# Patient Record
Sex: Female | Born: 1955 | Race: White | Hispanic: No | Marital: Married | Smoking: Never smoker
Health system: Southern US, Community
[De-identification: ages and names within clinical notes are randomized; demographics above are authoritative.]

## PROBLEM LIST (undated history)

## (undated) DIAGNOSIS — E669 Obesity, unspecified: Secondary | ICD-10-CM

## (undated) DIAGNOSIS — M1990 Unspecified osteoarthritis, unspecified site: Secondary | ICD-10-CM

## (undated) DIAGNOSIS — F32A Depression, unspecified: Secondary | ICD-10-CM

## (undated) DIAGNOSIS — F329 Major depressive disorder, single episode, unspecified: Secondary | ICD-10-CM

## (undated) DIAGNOSIS — M797 Fibromyalgia: Secondary | ICD-10-CM

## (undated) DIAGNOSIS — F419 Anxiety disorder, unspecified: Secondary | ICD-10-CM

---

## 1997-07-06 ENCOUNTER — Other Ambulatory Visit: Admission: RE | Admit: 1997-07-06 | Discharge: 1997-07-06 | Payer: Self-pay | Admitting: Obstetrics & Gynecology

## 1997-07-15 ENCOUNTER — Other Ambulatory Visit: Admission: RE | Admit: 1997-07-15 | Discharge: 1997-07-15 | Payer: Self-pay | Admitting: Family Medicine

## 1999-04-05 ENCOUNTER — Emergency Department (HOSPITAL_COMMUNITY): Admission: EM | Admit: 1999-04-05 | Discharge: 1999-04-05 | Payer: Self-pay | Admitting: Emergency Medicine

## 1999-04-05 ENCOUNTER — Encounter: Payer: Self-pay | Admitting: Emergency Medicine

## 2000-05-20 ENCOUNTER — Emergency Department (HOSPITAL_COMMUNITY): Admission: EM | Admit: 2000-05-20 | Discharge: 2000-05-20 | Payer: Self-pay | Admitting: Emergency Medicine

## 2000-05-20 ENCOUNTER — Encounter: Payer: Self-pay | Admitting: Emergency Medicine

## 2000-07-02 ENCOUNTER — Ambulatory Visit (HOSPITAL_COMMUNITY): Admission: RE | Admit: 2000-07-02 | Discharge: 2000-07-02 | Payer: Self-pay | Admitting: Neurology

## 2000-07-02 ENCOUNTER — Encounter: Payer: Self-pay | Admitting: Neurology

## 2013-05-20 ENCOUNTER — Emergency Department (HOSPITAL_COMMUNITY)
Admission: EM | Admit: 2013-05-20 | Discharge: 2013-05-21 | Disposition: A | Discharge status: Home / Self Care | Payer: Worker's Compensation | Attending: Emergency Medicine | Admitting: Emergency Medicine

## 2013-05-20 ENCOUNTER — Emergency Department (HOSPITAL_COMMUNITY): Payer: Worker's Compensation

## 2013-05-20 ENCOUNTER — Encounter (HOSPITAL_COMMUNITY): Payer: Self-pay | Admitting: Emergency Medicine

## 2013-05-20 DIAGNOSIS — R112 Nausea with vomiting, unspecified: Secondary | ICD-10-CM

## 2013-05-20 DIAGNOSIS — Z9889 Other specified postprocedural states: Secondary | ICD-10-CM | POA: Insufficient documentation

## 2013-05-20 DIAGNOSIS — R070 Pain in throat: Secondary | ICD-10-CM | POA: Insufficient documentation

## 2013-05-20 DIAGNOSIS — J029 Acute pharyngitis, unspecified: Secondary | ICD-10-CM | POA: Insufficient documentation

## 2013-05-20 DIAGNOSIS — Z79899 Other long term (current) drug therapy: Secondary | ICD-10-CM | POA: Insufficient documentation

## 2013-05-20 LAB — COMPREHENSIVE METABOLIC PANEL
ALT: 41 U/L — ABNORMAL HIGH (ref 0–35)
AST: 41 U/L — ABNORMAL HIGH (ref 0–37)
Albumin: 3.8 g/dL (ref 3.5–5.2)
Alkaline Phosphatase: 104 U/L (ref 39–117)
BUN: 14 mg/dL (ref 6–23)
CO2: 23 mEq/L (ref 19–32)
Calcium: 9.1 mg/dL (ref 8.4–10.5)
Chloride: 97 mEq/L (ref 96–112)
Creatinine, Ser: 1.3 mg/dL — ABNORMAL HIGH (ref 0.50–1.10)
GFR calc Af Amer: 51 mL/min — ABNORMAL LOW (ref >90)
GFR calc non Af Amer: 44 mL/min — ABNORMAL LOW (ref >90)
Glucose, Bld: 131 mg/dL — ABNORMAL HIGH (ref 70–99)
Potassium: 4.1 mEq/L (ref 3.7–5.3)
Sodium: 137 mEq/L (ref 137–147)
Total Bilirubin: 0.5 mg/dL (ref 0.3–1.2)
Total Protein: 7.8 g/dL (ref 6.0–8.3)

## 2013-05-20 LAB — CBC WITH DIFFERENTIAL/PLATELET
Basophils Absolute: 0 10*3/uL (ref 0.0–0.1)
Basophils Relative: 0 % (ref 0–1)
Eosinophils Absolute: 0.1 10*3/uL (ref 0.0–0.7)
Eosinophils Relative: 1 % (ref 0–5)
HCT: 38.9 % (ref 36.0–46.0)
Hemoglobin: 13.4 g/dL (ref 12.0–15.0)
Lymphocytes Relative: 10 % — ABNORMAL LOW (ref 12–46)
Lymphs Abs: 1.7 10*3/uL (ref 0.7–4.0)
MCH: 30.9 pg (ref 26.0–34.0)
MCHC: 34.4 g/dL (ref 30.0–36.0)
MCV: 89.6 fL (ref 78.0–100.0)
Monocytes Absolute: 1.1 10*3/uL — ABNORMAL HIGH (ref 0.1–1.0)
Monocytes Relative: 7 % (ref 3–12)
Neutro Abs: 13.6 10*3/uL — ABNORMAL HIGH (ref 1.7–7.7)
Neutrophils Relative %: 83 % — ABNORMAL HIGH (ref 43–77)
Platelets: 207 10*3/uL (ref 150–400)
RBC: 4.34 MIL/uL (ref 3.87–5.11)
RDW: 12.5 % (ref 11.5–15.5)
WBC: 16.5 10*3/uL — ABNORMAL HIGH (ref 4.0–10.5)

## 2013-05-20 MED ORDER — MAGIC MOUTHWASH: 5.0000 mL | Freq: Three times a day (TID) | ORAL | Status: AC | PRN

## 2013-05-20 MED ORDER — GI COCKTAIL ~~LOC~~
30.0000 mL | Freq: Once | ORAL | Status: AC
  Administered 2013-05-20: 30 mL via ORAL
  Filled 2013-05-20: qty 30

## 2013-05-20 MED ORDER — HYDROCODONE-ACETAMINOPHEN 7.5-325 MG/15ML PO SOLN: 15.0000 mL | Freq: Three times a day (TID) | ORAL | Status: AC | PRN

## 2013-05-20 MED ORDER — ONDANSETRON HCL 4 MG/2ML IJ SOLN
4.0000 mg | Freq: Once | INTRAMUSCULAR | Status: AC
  Administered 2013-05-20: 4 mg via INTRAVENOUS
  Filled 2013-05-20: qty 2

## 2013-05-20 MED ORDER — SODIUM CHLORIDE 0.9 % IV BOLUS (SEPSIS)
1000.0000 mL | INTRAVENOUS | Status: AC
  Administered 2013-05-20: 1000 mL via INTRAVENOUS

## 2013-05-20 MED ORDER — ONDANSETRON 4 MG PO TBDP: 4.0000 mg | ORAL_TABLET | Freq: Three times a day (TID) | ORAL | Status: AC | PRN

## 2013-05-20 MED ORDER — MORPHINE SULFATE 4 MG/ML IJ SOLN
4.0000 mg | Freq: Once | INTRAMUSCULAR | Status: AC
  Administered 2013-05-20: 4 mg via INTRAVENOUS
  Filled 2013-05-20: qty 1

## 2013-05-20 MED ORDER — PANTOPRAZOLE SODIUM 40 MG IV SOLR
40.0000 mg | Freq: Once | INTRAVENOUS | Status: AC
  Administered 2013-05-20: 40 mg via INTRAVENOUS
  Filled 2013-05-20: qty 40

## 2013-05-20 NOTE — ED Notes (Signed)
Pt states unable to keep anything down x 2 weeks. Hx surgery on c5-c6

## 2013-05-20 NOTE — ED Notes (Signed)
PA at bedside.

## 2013-05-20 NOTE — ED Notes (Signed)
Patient transported to X-ray 

## 2013-05-20 NOTE — ED Notes (Signed)
This RN attempted to start IV X 2 unsuccessfully. Primary RN notified.

## 2013-05-20 NOTE — ED Provider Notes (Signed)
CSN: [id number]     Arrival date & time 05/20/13  1848 History   First MD Initiated Contact with Patient 05/20/13 2015     Chief Complaint  Patient presents with  . Emesis     (Consider location/radiation/quality/duration/timing/severity/associated sxs/prior Treatment) HPI Pt is a 58yo female presenting 3 weeks after surgery to C5-C6. Pt states since surgery she has not been able to keep anything down due to pain, nausea and vomiting. Pt states she did have a 2 week f/u with surgical PA who stated she may start to eat normal foods as tolerated, because pt has still had difficulty and pain, PA recommended endoscopy for tomorrow morning to determine cause of pt's prolonged throat pain and difficulty eating. Reports worsened symptoms in last 4 days. Reports temp of 101 yesterday and 8 episodes of emesis.  Denies fever or emesis today. Denies blood or bile in emesis. Denies sick contacts.  States she called her doctor today but states they did not call back until after 5pm, RN recommended pt come to ED stating she may be dehydrated.    No past medical history on file. No past surgical history on file. No family history on file. History  Substance Use Topics  . Smoking status: Never Smoker   . Smokeless tobacco: Never Used  . Alcohol Use: No   OB History   Grav Para Term Preterm Abortions TAB SAB Ect Mult Living                 Review of Systems  Constitutional: Positive for fever and chills. Negative for fatigue.  HENT: Positive for sore throat. Negative for voice change.   Respiratory: Positive for cough. Negative for shortness of breath.   Cardiovascular: Negative for chest pain.  Gastrointestinal: Positive for nausea and vomiting. Negative for abdominal pain and diarrhea.  All other systems reviewed and are negative.      Allergies  Review of patient's allergies indicates no known allergies.  Home Medications   Current Outpatient Rx  Name  Route  Sig  Dispense  Refill  .  diazepam (VALIUM) 5 MG tablet   Oral   Take 5 mg by mouth every 6 (six) hours as needed for anxiety.         Marland Kitchen HYDROcodone-acetaminophen (NORCO/VICODIN) 5-325 MG per tablet   Oral   Take 0.5 tablets by mouth every 6 (six) hours as needed for moderate pain.          . phenol (CHLORASEPTIC) 1.4 % LIQD   Mouth/Throat   Use as directed 5 sprays in the mouth or throat as needed for throat irritation / pain.          . promethazine (PHENERGAN) 25 MG tablet   Oral   Take 25 mg by mouth every 6 (six) hours as needed for nausea or vomiting.         . RABEprazole (ACIPHEX) 20 MG tablet   Oral   Take 20 mg by mouth daily.         . traMADol (ULTRAM) 50 MG tablet   Oral   Take 50 mg by mouth every 6 (six) hours as needed.         . Alum & Mag Hydroxide-Simeth (MAGIC MOUTHWASH) SOLN   Oral   Take 5 mLs by mouth 3 (three) times daily as needed for mouth pain.   20 mL   0   . HYDROcodone-acetaminophen (HYCET) 7.5-325 mg/15 ml solution   Oral   Take 15  mLs by mouth every 8 (eight) hours as needed for moderate pain.   120 mL   0   . ondansetron (ZOFRAN ODT) 4 MG disintegrating tablet   Oral   Take 1 tablet (4 mg total) by mouth every 8 (eight) hours as needed for nausea.   10 tablet   0    BP 106/68  Pulse 103  Temp(Src) 99 F (37.2 C) (Oral)  Resp 20  Ht 5\' 4"  (1.626 m)  Wt 220 lb (99.791 kg)  BMI 37.74 kg/m2  SpO2 91% Physical Exam  Nursing note and vitals reviewed. Constitutional: She appears well-developed and well-nourished. No distress.  Pt lying comfortably in exam bed, NAD. Holding emesis bag but no active vomiting.  HENT:  Head: Normocephalic and atraumatic.  Eyes: Conjunctivae are normal. No scleral icterus.  Neck: Normal range of motion. Neck supple. No JVD present. No tracheal deviation present. No thyromegaly present.  Cardiovascular: Normal rate, regular rhythm and normal heart sounds.   Pulmonary/Chest: Effort normal and breath sounds normal. No  stridor. No respiratory distress. She has no wheezes. She has no rales. She exhibits no tenderness.  No respiratory distress, able to speak in full sentences w/o difficulty. Lungs: CTAB. Occasional productive cough  Abdominal: Soft. Bowel sounds are normal. She exhibits no distension and no mass. There is no tenderness. There is no rebound and no guarding.  Soft, non-distended, non-tender. No CVAT  Musculoskeletal: Normal range of motion.  Lymphadenopathy:    She has no cervical adenopathy.  Neurological: She is alert.  Skin: Skin is warm and dry. She is not diaphoretic.    ED Course  Procedures (including critical care time) Labs Review Labs Reviewed  CBC WITH DIFFERENTIAL - Abnormal; Notable for the following:    WBC 16.5 (*)    Neutrophils Relative % 83 (*)    Neutro Abs 13.6 (*)    Lymphocytes Relative 10 (*)    Monocytes Absolute 1.1 (*)    All other components within normal limits  COMPREHENSIVE METABOLIC PANEL - Abnormal; Notable for the following:    Glucose, Bld 131 (*)    Creatinine, Ser 1.30 (*)    AST 41 (*)    ALT 41 (*)    GFR calc non Af Amer 44 (*)    GFR calc Af Amer 51 (*)    All other components within normal limits   Imaging Review Dg Chest 2 View  05/20/2013   CLINICAL DATA:  Cough  EXAM: CHEST  2 VIEW  COMPARISON:  None.  FINDINGS: The heart size and mediastinal contours are within normal limits. Both lungs are clear. Degenerative joint changes of the spine are noted. Patient is status post prior fixation of lower cervical spine. Prior cholecystectomy clips are noted.  IMPRESSION: No active cardiopulmonary disease.   Electronically Signed   By: Sherian ReinWei-Chen  Lin M.D.   On: 05/20/2013 21:35    EKG Interpretation   None       MDM   Final diagnoses:  Throat pain  Nausea and vomiting    Pt appears well, non-toxic, mucous membranes moist.  No active vomiting or dry heaving. Pt c/o throat pain.  Pt requesting to stay due to snow.  CBC: WBC-16.5 CMP:  AST-41 ALT-41, GFR-51.  No previous labs to compare to.  Low concern for cholecystitis as pt is afebrile, abd-soft, non-distended, non-tender, no murphy's sign.   CXR obtained due to intermittent cough and WBC count.   CXR: no active cardiopulmonary disease.  No vomiting while in ED. Pt able to keep down 1/2 cup of ice chips.  Discussed pt with Dr. Jeraldine Loots, will discharge pt home with pain and nausea medication. Pt advised to f/u as scheduled with PCP and surgeon to discuss further evaluation via endoscopy for throat pain. Pt verbalized understanding and agreement with tx plan.    Junius Finner, PA-C 05/21/13 0045

## 2013-05-21 NOTE — ED Provider Notes (Signed)
Medical screening examination/treatment/procedure(s) were performed by non-physician practitioner and as supervising physician I was immediately available for consultation/collaboration.  Jerald Hennington, MD 05/21/13 1600 

## 2015-02-08 ENCOUNTER — Other Ambulatory Visit: Payer: Self-pay | Admitting: Nurse Practitioner

## 2015-02-08 DIAGNOSIS — N6001 Solitary cyst of right breast: Secondary | ICD-10-CM

## 2015-02-15 ENCOUNTER — Ambulatory Visit
Admission: RE | Admit: 2015-02-15 | Discharge: 2015-02-15 | Disposition: A | Discharge status: Home / Self Care | Payer: BC Managed Care – PPO | Source: Ambulatory Visit | Attending: Nurse Practitioner | Admitting: Nurse Practitioner

## 2015-02-15 DIAGNOSIS — N6001 Solitary cyst of right breast: Secondary | ICD-10-CM

## 2015-04-24 ENCOUNTER — Emergency Department (HOSPITAL_COMMUNITY): Payer: BC Managed Care – PPO

## 2015-04-24 ENCOUNTER — Emergency Department (HOSPITAL_COMMUNITY)
Admission: EM | Admit: 2015-04-24 | Discharge: 2015-04-24 | Disposition: A | Discharge status: Home / Self Care | Payer: BC Managed Care – PPO | Attending: Emergency Medicine | Admitting: Emergency Medicine

## 2015-04-24 ENCOUNTER — Encounter (HOSPITAL_COMMUNITY): Payer: Self-pay | Admitting: *Deleted

## 2015-04-24 DIAGNOSIS — R55 Syncope and collapse: Secondary | ICD-10-CM | POA: Insufficient documentation

## 2015-04-24 DIAGNOSIS — Z79899 Other long term (current) drug therapy: Secondary | ICD-10-CM | POA: Diagnosis not present

## 2015-04-24 DIAGNOSIS — E669 Obesity, unspecified: Secondary | ICD-10-CM | POA: Diagnosis not present

## 2015-04-24 DIAGNOSIS — F329 Major depressive disorder, single episode, unspecified: Secondary | ICD-10-CM | POA: Insufficient documentation

## 2015-04-24 DIAGNOSIS — Y9289 Other specified places as the place of occurrence of the external cause: Secondary | ICD-10-CM | POA: Insufficient documentation

## 2015-04-24 DIAGNOSIS — Y999 Unspecified external cause status: Secondary | ICD-10-CM | POA: Insufficient documentation

## 2015-04-24 DIAGNOSIS — S0990XA Unspecified injury of head, initial encounter: Secondary | ICD-10-CM | POA: Insufficient documentation

## 2015-04-24 DIAGNOSIS — Y9389 Activity, other specified: Secondary | ICD-10-CM | POA: Diagnosis not present

## 2015-04-24 DIAGNOSIS — R42 Dizziness and giddiness: Secondary | ICD-10-CM | POA: Diagnosis not present

## 2015-04-24 DIAGNOSIS — F419 Anxiety disorder, unspecified: Secondary | ICD-10-CM | POA: Insufficient documentation

## 2015-04-24 DIAGNOSIS — M1990 Unspecified osteoarthritis, unspecified site: Secondary | ICD-10-CM | POA: Diagnosis not present

## 2015-04-24 DIAGNOSIS — W01198A Fall on same level from slipping, tripping and stumbling with subsequent striking against other object, initial encounter: Secondary | ICD-10-CM | POA: Insufficient documentation

## 2015-04-24 HISTORY — DX: Depression, unspecified: F32.A

## 2015-04-24 HISTORY — DX: Major depressive disorder, single episode, unspecified: F32.9

## 2015-04-24 HISTORY — DX: Anxiety disorder, unspecified: F41.9

## 2015-04-24 HISTORY — DX: Obesity, unspecified: E66.9

## 2015-04-24 HISTORY — DX: Unspecified osteoarthritis, unspecified site: M19.90

## 2015-04-24 HISTORY — DX: Fibromyalgia: M79.7

## 2015-04-24 LAB — URINALYSIS, ROUTINE W REFLEX MICROSCOPIC
Bilirubin Urine: NEGATIVE
Glucose, UA: NEGATIVE mg/dL
Hgb urine dipstick: NEGATIVE
Ketones, ur: NEGATIVE mg/dL
Leukocytes, UA: NEGATIVE
Nitrite: NEGATIVE
Protein, ur: NEGATIVE mg/dL
Specific Gravity, Urine: 1.004 — ABNORMAL LOW (ref 1.005–1.030)
pH: 6 (ref 5.0–8.0)

## 2015-04-24 LAB — CBC
HCT: 40.2 % (ref 36.0–46.0)
Hemoglobin: 13.7 g/dL (ref 12.0–15.0)
MCH: 30.5 pg (ref 26.0–34.0)
MCHC: 34.1 g/dL (ref 30.0–36.0)
MCV: 89.5 fL (ref 78.0–100.0)
PLATELETS: 304 10*3/uL (ref 150–400)
RBC: 4.49 MIL/uL (ref 3.87–5.11)
RDW: 12.7 % (ref 11.5–15.5)
WBC: 8.5 10*3/uL (ref 4.0–10.5)

## 2015-04-24 LAB — BASIC METABOLIC PANEL
Anion gap: 12 (ref 5–15)
BUN: 13 mg/dL (ref 6–20)
CALCIUM: 9.1 mg/dL (ref 8.9–10.3)
CO2: 23 mmol/L (ref 22–32)
Chloride: 105 mmol/L (ref 101–111)
Creatinine, Ser: 0.81 mg/dL (ref 0.44–1.00)
GFR calc Af Amer: >60 mL/min (ref >60)
GFR calc non Af Amer: >60 mL/min (ref >60)
Glucose, Bld: 102 mg/dL — ABNORMAL HIGH (ref 65–99)
Potassium: 3.8 mmol/L (ref 3.5–5.1)
SODIUM: 140 mmol/L (ref 135–145)

## 2015-04-24 MED ORDER — MECLIZINE HCL 25 MG PO TABS
25.0000 mg | ORAL_TABLET | Freq: Once | ORAL | Status: AC
  Administered 2015-04-24: 25 mg via ORAL
  Filled 2015-04-24: qty 1

## 2015-04-24 MED ORDER — ONDANSETRON HCL 4 MG PO TABS: 4.0000 mg | ORAL_TABLET | Freq: Four times a day (QID) | ORAL | Status: AC

## 2015-04-24 MED ORDER — ONDANSETRON HCL 4 MG PO TABS: 4.0000 mg | ORAL_TABLET | Freq: Four times a day (QID) | ORAL | Status: DC

## 2015-04-24 MED ORDER — MECLIZINE HCL 50 MG PO TABS: 50.0000 mg | ORAL_TABLET | Freq: Three times a day (TID) | ORAL | Status: DC | PRN

## 2015-04-24 MED ORDER — MECLIZINE HCL 25 MG PO TABS: 25.0000 mg | ORAL_TABLET | Freq: Three times a day (TID) | ORAL | Status: AC | PRN

## 2015-04-24 NOTE — Discharge Instructions (Signed)
Schedule a follow up appointment with your PCP.   Benign Positional Vertigo Vertigo is the feeling that you or your surroundings are moving when they are not. Benign positional vertigo is the most common form of vertigo. The cause of this condition is not serious (is benign). This condition is triggered by certain movements and positions (is positional). This condition can be dangerous if it occurs while you are doing something that could endanger you or others, such as driving.  CAUSES In many cases, the cause of this condition is not known. It may be caused by a disturbance in an area of the inner ear that helps your brain to sense movement and balance. This disturbance can be caused by a viral infection (labyrinthitis), head injury, or repetitive motion. RISK FACTORS This condition is more likely to develop in:  Women.  People who are 6 years of age or older. SYMPTOMS Symptoms of this condition usually happen when you move your head or your eyes in different directions. Symptoms may start suddenly, and they usually last for less than a minute. Symptoms may include:  Loss of balance and falling.  Feeling like you are spinning or moving.  Feeling like your surroundings are spinning or moving.  Nausea and vomiting.  Blurred vision.  Dizziness.  Involuntary eye movement (nystagmus). Symptoms can be mild and cause only slight annoyance, or they can be severe and interfere with daily life. Episodes of benign positional vertigo may return (recur) over time, and they may be triggered by certain movements. Symptoms may improve over time. DIAGNOSIS This condition is usually diagnosed by medical history and a physical exam of the head, neck, and ears. You may be referred to a health care provider who specializes in ear, nose, and throat (ENT) problems (otolaryngologist) or a provider who specializes in disorders of the nervous system (neurologist). You may have additional testing,  including:  MRI.  A CT scan.  Eye movement tests. Your health care provider may ask you to change positions quickly while he or she watches you for symptoms of benign positional vertigo, such as nystagmus. Eye movement may be tested with an electronystagmogram (ENG), caloric stimulation, the Dix-Hallpike test, or the roll test.  An electroencephalogram (EEG). This records electrical activity in your brain.  Hearing tests. TREATMENT Usually, your health care provider will treat this by moving your head in specific positions to adjust your inner ear back to normal. Surgery may be needed in severe cases, but this is rare. In some cases, benign positional vertigo may resolve on its own in 2-4 weeks. HOME CARE INSTRUCTIONS Safety  Move slowly.Avoid sudden body or head movements.  Avoid driving.  Avoid operating heavy machinery.  Avoid doing any tasks that would be dangerous to you or others if a vertigo episode would occur.  If you have trouble walking or keeping your balance, try using a cane for stability. If you feel dizzy or unstable, sit down right away.  Return to your normal activities as told by your health care provider. Ask your health care provider what activities are safe for you. General Instructions  Take over-the-counter and prescription medicines only as told by your health care provider.  Avoid certain positions or movements as told by your health care provider.  Drink enough fluid to keep your urine clear or pale yellow.  Keep all follow-up visits as told by your health care provider. This is important. SEEK MEDICAL CARE IF:  You have a fever.  Your condition gets worse  or you develop new symptoms.  Your family or friends notice any behavioral changes.  Your nausea or vomiting gets worse.  You have numbness or a "pins and needles" sensation. SEEK IMMEDIATE MEDICAL CARE IF:  You have difficulty speaking or moving.  You are always dizzy.  You  faint.  You develop severe headaches.  You have weakness in your legs or arms.  You have changes in your hearing or vision.  You develop a stiff neck.  You develop sensitivity to light.   This information is not intended to replace advice given to you by your health care provider. Make sure you discuss any questions you have with your health care provider.   Document Released: 12/18/2005 Document Revised: 12/01/2014 Document Reviewed: 07/05/2014 Elsevier Interactive Patient Education [employer]

## 2015-04-24 NOTE — ED Provider Notes (Signed)
CSN: [id number]     Arrival date & time 04/24/15  1328 History   First MD Initiated Contact with Patient 04/24/15 1455     Chief Complaint  Patient presents with  . Dizziness  . Loss of Consciousness   HPI  Ms. Korenek is a 60 year old female with PMHx of fibromyalgia, anxiety and depression presenting with dizziness. She reports intermittent episodes of dizziness for the past 3 months. The dizziness is described as room spinning. She states the episodes are triggered by rolling over in bed and rising from sitting to standing. She states that the episodes last for a few seconds before resolving. The dizziness occurs frequently for 1-2 days then resolves for a few weeks. She was seen by her PCP for this and diagnosed with vertigo. She was given "a dizzy pill" which she has not taken because she "doesn't trust him". She states the most recent spell has lasted 3 days. She reports having a dizzy spell last evening and then developing a generalized, throbbing headache and nausea afterwards which resolved soon afterwards. She also reports two episodes of passing out after the dizzy spells. She state that she will lose consciousness for a few seconds. Today she passed out and fell to the floor striking her head. She was unconsciousness for approximately 5 seconds. She states she is not currently dizzy. Denies fevers, chills, head trauma, neck pain, extremity weakness, extremity numbness, speech difficulty, facial droop, AMS, chest pain or SOB. She has no other complaints today.   Past Medical History  Diagnosis Date  . Depression   . Anxiety   . Obesity   . Arthritis   . Fibromyalgia    History reviewed. No pertinent past surgical history. History reviewed. No pertinent family history. Social History  Substance Use Topics  . Smoking status: Never Smoker   . Smokeless tobacco: Never Used  . Alcohol Use: No   OB History    No data available     Review of Systems  Constitutional: Negative for  fever and chills.  Eyes: Negative for visual disturbance.  Respiratory: Negative for shortness of breath.   Cardiovascular: Negative for chest pain.  Gastrointestinal: Positive for nausea. Negative for vomiting, abdominal pain and diarrhea.  Musculoskeletal: Negative for myalgias, arthralgias and neck pain.  Neurological: Positive for dizziness, syncope and headaches. Negative for facial asymmetry, speech difficulty, weakness and numbness.  Psychiatric/Behavioral: Negative for confusion.  All other systems reviewed and are negative.     Allergies  Review of patient's allergies indicates no known allergies.  Home Medications   Prior to Admission medications   Medication Sig Start Date End Date Taking? Authorizing Provider  desvenlafaxine (PRISTIQ) 50 MG 24 hr tablet Take 50 mg by mouth daily.   Yes Historical Provider, MD  ibuprofen (ADVIL,MOTRIN) 200 MG tablet Take 400-600 mg by mouth every 6 (six) hours as needed for headache.   Yes Historical Provider, MD  RABEprazole (ACIPHEX) 20 MG tablet Take 20 mg by mouth daily.   Yes Historical Provider, MD  zolpidem (AMBIEN) 5 MG tablet Take 5 mg by mouth at bedtime as needed for sleep.   Yes Historical Provider, MD  Alum & Mag Hydroxide-Simeth (MAGIC MOUTHWASH) SOLN Take 5 mLs by mouth 3 (three) times daily as needed for mouth pain. 05/20/13   Junius Finner, PA-C  HYDROcodone-acetaminophen (HYCET) 7.5-325 mg/15 ml solution Take 15 mLs by mouth every 8 (eight) hours as needed for moderate pain. 05/20/13   Junius Finner, PA-C  meclizine (ANTIVERT)  25 MG tablet Take 1 tablet (25 mg total) by mouth 3 (three) times daily as needed. 04/24/15   Shamiah Kahler, PA-C  ondansetron (ZOFRAN ODT) 4 MG disintegrating tablet Take 1 tablet (4 mg total) by mouth every 8 (eight) hours as needed for nausea. 05/20/13   Junius Finner, PA-C  ondansetron (ZOFRAN) 4 MG tablet Take 1 tablet (4 mg total) by mouth every 6 (six) hours. 04/24/15   Kimorah Ridolfi, PA-C   BP  148/80 mmHg  Pulse 55  Temp(Src) 97.4 F (36.3 C) (Oral)  Resp 18  SpO2 100% Physical Exam  Constitutional: She is oriented to person, place, and time. She appears well-developed and well-nourished. No distress.  HENT:  Head: Normocephalic and atraumatic.  Mouth/Throat: Oropharynx is clear and moist. No oropharyngeal exudate.  Eyes: Conjunctivae and EOM are normal. Pupils are equal, round, and reactive to light. Right eye exhibits no discharge. Left eye exhibits no discharge.  No nystagmus  Neck: Normal range of motion. Neck supple. No rigidity.  No midline cervical tenderness. FROM of neck without pain  Cardiovascular: Normal rate, regular rhythm and normal heart sounds.   Pulmonary/Chest: Effort normal and breath sounds normal. No respiratory distress.  Abdominal: Soft. There is no tenderness. There is no rebound and no guarding.  Musculoskeletal: Normal range of motion.  Neurological: She is alert and oriented to person, place, and time. No cranial nerve deficit. She exhibits normal muscle tone. Coordination normal.  Cranial nerves 3-12 tested and intact. 5/5 strength of all major muscle groups. Sensation to light touch intact throughout. Finger to nose coordinated. Walks with a steady gait unassisted.   Skin: Skin is warm and dry.  Psychiatric: She has a normal mood and affect. Her behavior is normal.  Nursing note and vitals reviewed.   ED Course  Procedures (including critical care time) Labs Review Labs Reviewed  BASIC METABOLIC PANEL - Abnormal; Notable for the following:    Glucose, Bld 102 (*)    All other components within normal limits  URINALYSIS, ROUTINE W REFLEX MICROSCOPIC (NOT AT [facility]) - Abnormal; Notable for the following:    Specific Gravity, Urine 1.004 (*)    All other components within normal limits  CBC    Imaging Review Ct Head Wo Contrast  04/24/2015  CLINICAL DATA:  Having dizzy spells.  Syncope.  Fell involved hip. EXAM: CT HEAD WITHOUT CONTRAST  TECHNIQUE: Contiguous axial images were obtained from the base of the skull through the vertex without intravenous contrast. COMPARISON:  08/19/2012.  MRI from 01/15/2015. FINDINGS: There is no evidence for acute hemorrhage, hydrocephalus, mass lesion, or abnormal extra-axial fluid collection. No definite CT evidence for acute infarction. The visualized paranasal sinuses and mastoid air cells are clear. Stable 6 mm polypoid lesion left maxillary sinus. IMPRESSION: No acute intracranial abnormality. Electronically Signed   By: Kennith Center M.D.   On: 04/24/2015 16:36   I have personally reviewed and evaluated these images and lab results as part of my medical decision-making.   EKG Interpretation None      MDM   Final diagnoses:  Vertigo   60 year old female presenting with intermittent dizziness x 3 months. Recurrent episode this morning resulted in LOC and head injury. VSS. Pt is non-toxic appearing, in no acute distress. Non-focal neuro exam. No nystagmus noted. Head is atraumatic. Lab work unremarkable. Head CT without acute abnormality. Given meclizine in ED and pt was able to ambulate with a steady gait and no recurrent dizziness. Pt presentation consistent  with vertigo. Will discharge with meclizine and instruction to follow up with her PCP. She has a new patient appointment scheduled with a new PCP since she does not like her current one.  At this time there does not appear to be any evidence of an acute emergency medical condition and the patient appears stable for discharge with appropriate outpatient follow up. Diagnosis was discussed with patient who verbalizes understanding and is agreeable to discharge. Pt case discussed with Dr. Fredderick Phenix who agrees with my plan. Return precautions given in discharge paperwork and discussed with pt at bedside. Pt is stable for discharge.       Rolm Gala Tamon Parkerson, PA-C 04/24/15 1717  Rolan Bucco, MD 04/24/15 1610

## 2015-04-24 NOTE — ED Notes (Signed)
Off unit with CT 

## 2015-04-24 NOTE — ED Notes (Addendum)
Pt reports intermittent episodes of dizziness since nov, latest episode lasting 3 days. Reports having nausea, "feels like room is spinning" and several syncopal episodes. Reports passing out today and hitting head.

## 2015-04-24 NOTE — ED Notes (Signed)
Pt ambulated in the hallway in a steady manner without any issues.

## 2015-09-26 ENCOUNTER — Other Ambulatory Visit: Payer: Self-pay | Admitting: Internal Medicine

## 2015-09-26 DIAGNOSIS — N6310 Unspecified lump in the right breast, unspecified quadrant: Secondary | ICD-10-CM

## 2015-10-06 ENCOUNTER — Other Ambulatory Visit: Payer: Self-pay

## 2016-02-03 ENCOUNTER — Ambulatory Visit
Admission: RE | Admit: 2016-02-03 | Discharge: 2016-02-03 | Disposition: A | Discharge status: Home / Self Care | Payer: BC Managed Care – PPO | Source: Ambulatory Visit | Attending: Internal Medicine | Admitting: Internal Medicine

## 2016-02-03 DIAGNOSIS — N6310 Unspecified lump in the right breast, unspecified quadrant: Secondary | ICD-10-CM

## 2018-10-05 IMAGING — MG 2D DIGITAL DIAGNOSTIC BILATERAL MAMMOGRAM WITH CAD AND ADJUNCT T
8 of 16 series · 8 of 32 positions shown · non-contrast
Comparison: Previous exam(s).

CLINICAL DATA: Short-term interval follow-up of a probable benign
cyst in the right breast.

EXAM:
2D DIGITAL DIAGNOSTIC BILATERAL MAMMOGRAM WITH CAD AND ADJUNCT TOMO
ULTRASOUND RIGHT BREAST

[L CC (1 of 2)]
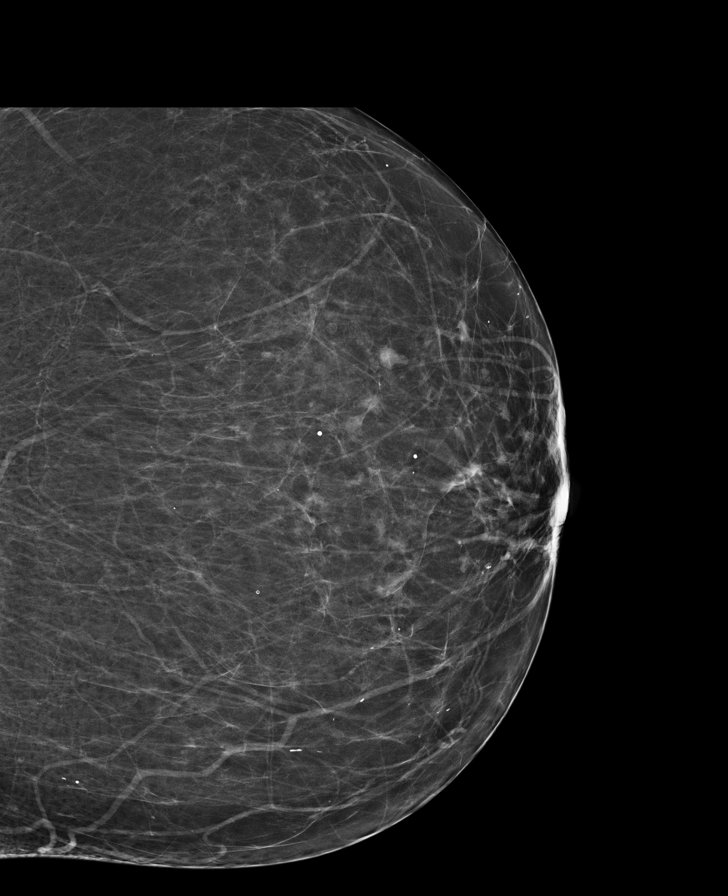

[R CC (1 of 2)]
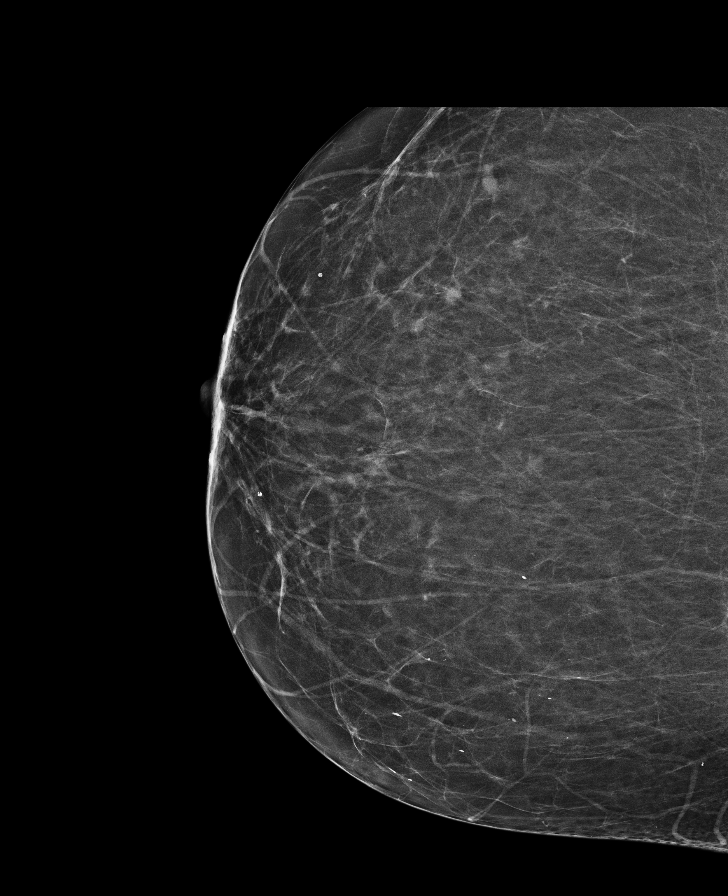

[L MLO]
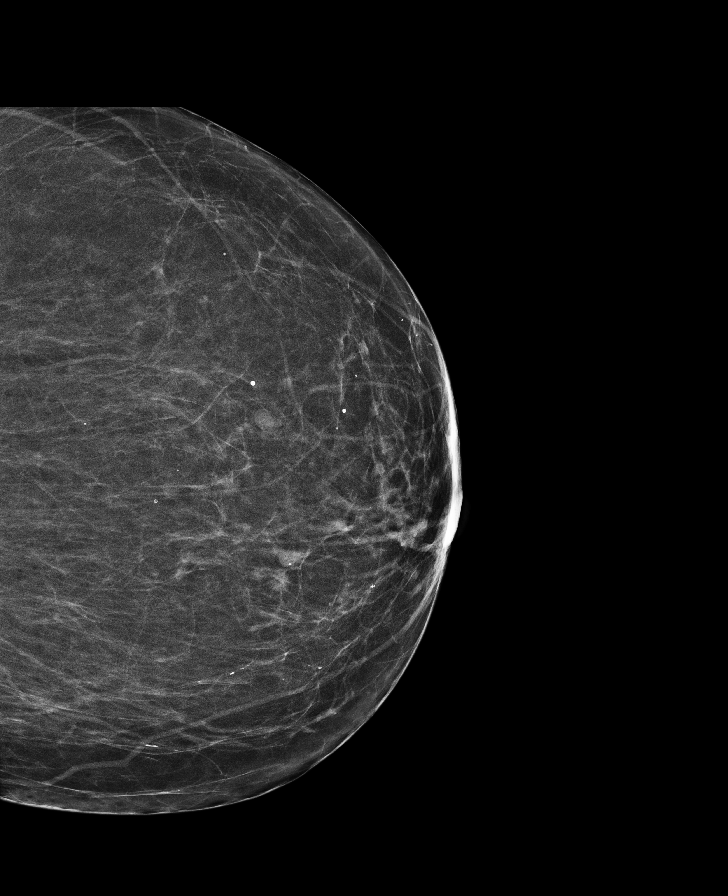

[R MLO]
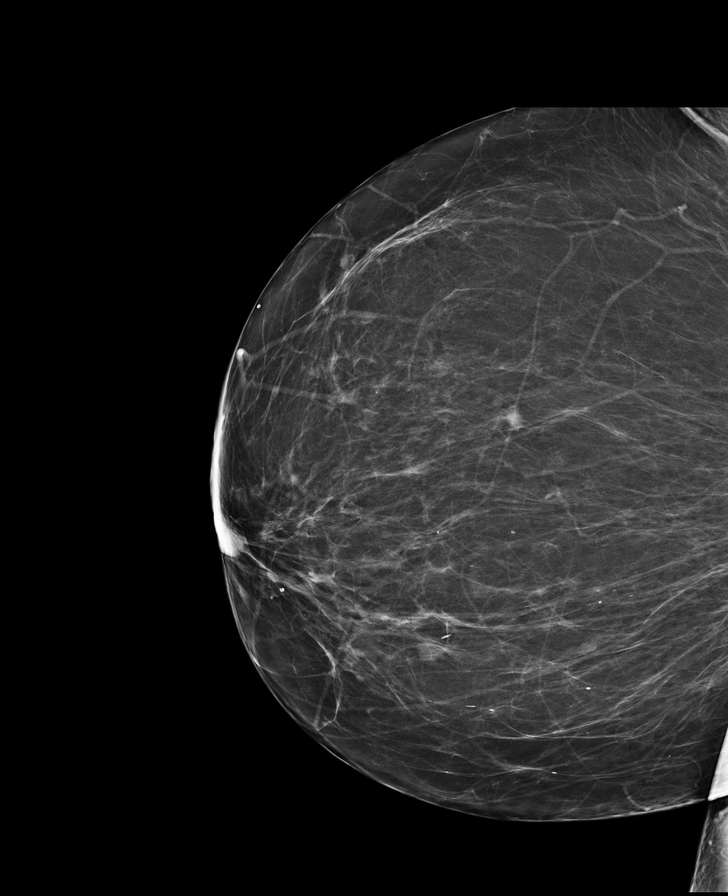

[L CC (2 of 2)]
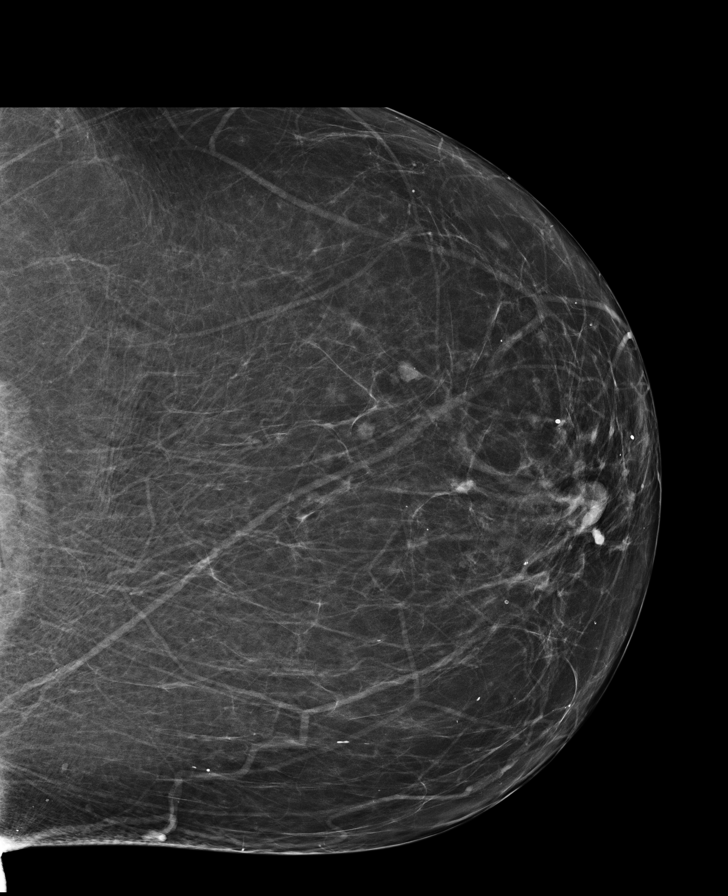

[R CC (2 of 2)]
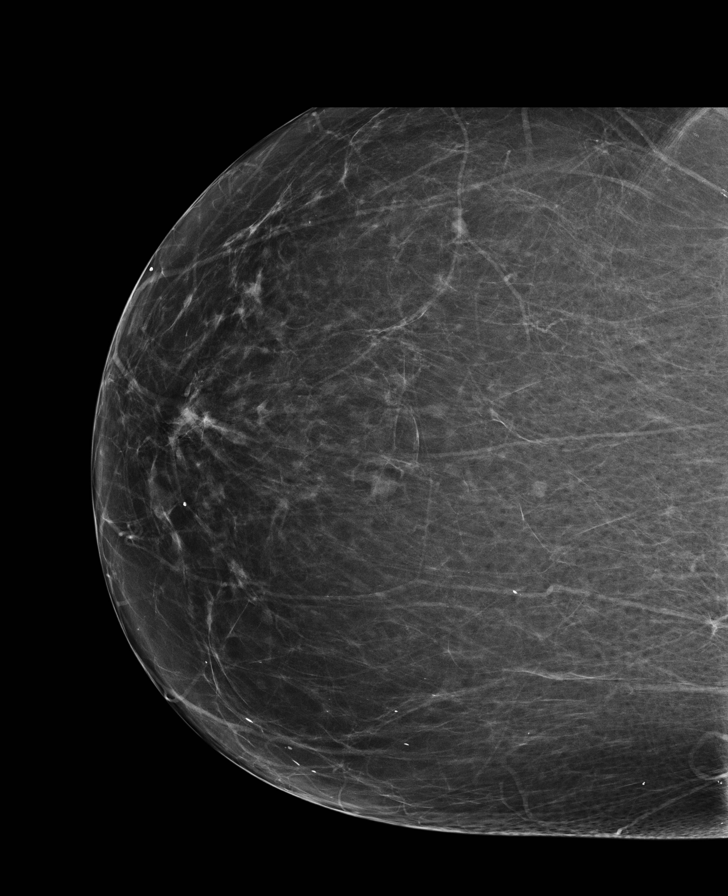

[R CC synth-2D]
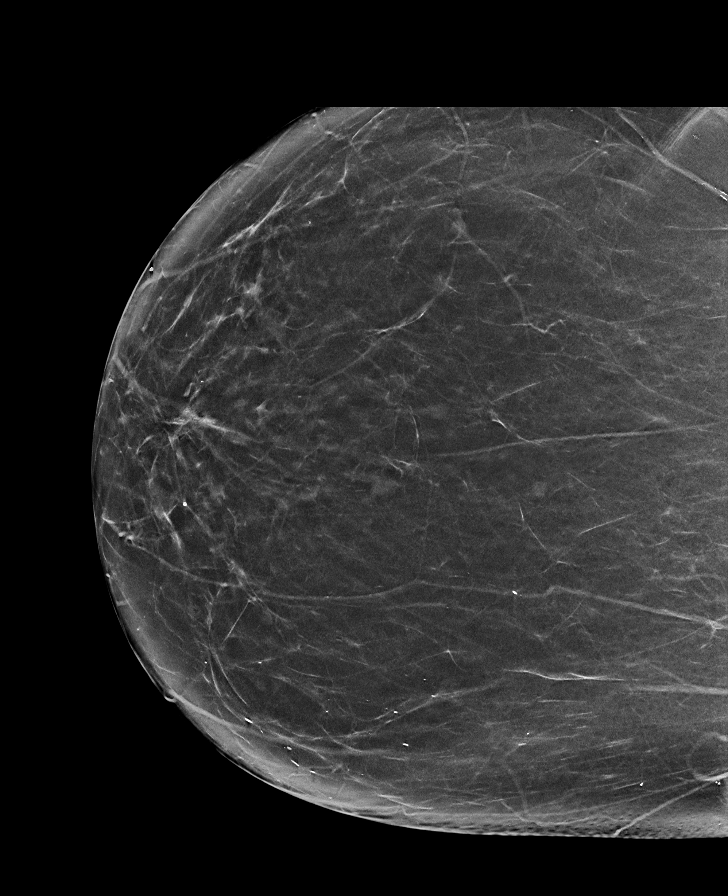

[L CC synth-2D]
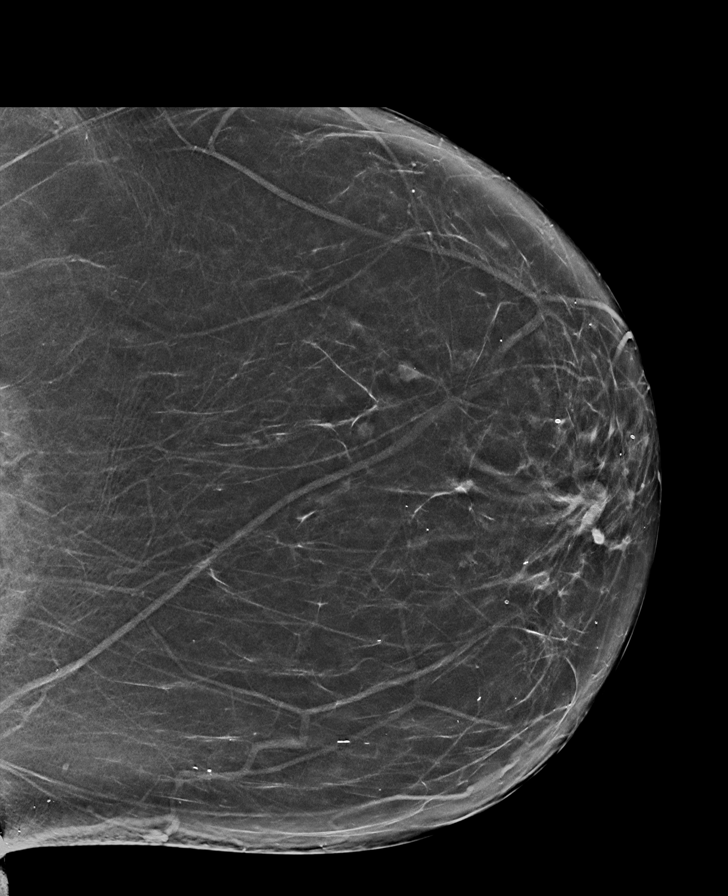

[8 of 32 positions shown; findings below may reference images not displayed]

ACR Breast Density Category b: There are scattered areas of
fibroglandular density.
FINDINGS: The 4 mm ovoid mass in lateral aspect of the right breast is stable.
No suspicious mass or malignant type microcalcifications seen in
either breast.

Mammographic images were processed with CAD.

On physical exam, I do not palpate a mass in the or lateral aspect
of the right breast.

Targeted ultrasound is performed, showing a stable cyst in the right
breast at [DATE] 5 cm from the nipple measuring 3 x 4 x 3 mm.
Previously it measured 3 x 3 x 3 mm.
IMPRESSION: Stable probable benign cyst in the right breast.

RECOMMENDATION:
Bilateral diagnostic mammogram and right breast ultrasound in 1 year
is recommended to document 2 year stability.

I have discussed the findings and recommendations with the patient.
Results were also provided in writing at the conclusion of the
visit. If applicable, a reminder letter will be sent to the patient
regarding the next appointment.

BI-RADS CATEGORY  3: Probably benign.
